# Patient Record
Sex: Male | Born: 2005 | Hispanic: No | Marital: Single | Smoking: Never smoker
Health system: Southern US, Community
[De-identification: ages and names within clinical notes are randomized; demographics above are authoritative.]

## PROBLEM LIST (undated history)

## (undated) DIAGNOSIS — M5416 Radiculopathy, lumbar region: Secondary | ICD-10-CM

---
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
## 2023-09-12 ENCOUNTER — Emergency Department
Admission: EM | Admit: 2023-09-12 | Discharge: 2023-09-12 | Disposition: A | Discharge status: Home / Self Care | Attending: Emergency Medicine | Admitting: Emergency Medicine

## 2023-09-12 ENCOUNTER — Emergency Department

## 2023-09-12 DIAGNOSIS — S299XXA Unspecified injury of thorax, initial encounter: Secondary | ICD-10-CM | POA: Diagnosis present

## 2023-09-12 DIAGNOSIS — S29011A Strain of muscle and tendon of front wall of thorax, initial encounter: Secondary | ICD-10-CM | POA: Insufficient documentation

## 2023-09-12 DIAGNOSIS — Y990 Civilian activity done for income or pay: Secondary | ICD-10-CM | POA: Insufficient documentation

## 2023-09-12 DIAGNOSIS — X503XXA Overexertion from repetitive movements, initial encounter: Secondary | ICD-10-CM | POA: Insufficient documentation

## 2023-09-12 HISTORY — DX: Radiculopathy, lumbar region: M54.16

## 2023-09-12 MED ORDER — KETOROLAC TROMETHAMINE 15 MG/ML IJ SOLN
15.0000 mg | Freq: Once | INTRAMUSCULAR | Status: AC
  Administered 2023-09-12: 15 mg via INTRAMUSCULAR
  Filled 2023-09-12: qty 1

## 2023-09-12 MED ORDER — NAPROXEN 500 MG PO TABS: 500.0000 mg | ORAL_TABLET | Freq: Two times a day (BID) | ORAL | 0 refills | Status: AC

## 2023-09-12 MED ORDER — OXYCODONE-ACETAMINOPHEN 5-325 MG PO TABS
1.0000 | ORAL_TABLET | Freq: Once | ORAL | Status: AC
  Administered 2023-09-12: 1 via ORAL
  Filled 2023-09-12: qty 1

## 2023-09-12 NOTE — ED Triage Notes (Signed)
 Per EMS, Pt, from [facility], c/o L chest pain x 3 days worsening today.  Pain score 7/10.  Pt reports the pain feels like cramping.  Denies associated cardiac symptoms.  Denies GU/GI complaints. Denies medical history.

## 2023-09-12 NOTE — ED Provider Notes (Signed)
 [facility] Provider Note    Event Date/Time   First MD Initiated Contact with Patient 09/12/23 [phone]     (approximate)   History   Chief Complaint: Chest Pain   HPI  Jeffery Padilla is a 18 y.o. male with no significant past medical history who comes ED complaining of left chest pain for the past 3 days, constant, worse with movement.  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  No diaphoresis vomiting or radiation.  No shortness of breath.  No palpitations or syncope.        Past Medical History:  Diagnosis Date   Lumbar radiculopathy     Current Outpatient Rx   Order #: [id number] Class: Normal    History reviewed. No pertinent surgical history.  Physical Exam   Triage Vital Signs: ED Triage Vitals  Encounter Vitals Group     BP 09/12/23 0923 (!) 150/76     Girls Systolic BP Percentile --      Girls Diastolic BP Percentile --      Boys Systolic BP Percentile --      Boys Diastolic BP Percentile --      Pulse Rate 09/12/23 0923 (!) 57     Resp 09/12/23 0923 20     Temp 09/12/23 0923 98.3 F (36.8 C)     Temp Source 09/12/23 0923 Oral     SpO2 09/12/23 0921 100 %     Weight 09/12/23 0923 (!) 315 lb (142.9 kg)     Height 09/12/23 0923 6' 3 (1.905 m)     Head Circumference --      Peak Flow --      Pain Score 09/12/23 0923 7     Pain Loc --      Pain Education --      Exclude from Growth Chart --     Most recent vital signs: Vitals:   09/12/23 0921 09/12/23 0923  BP:  (!) 150/76  Pulse:  (!) 57  Resp:  20  Temp:  98.3 F (36.8 C)  SpO2: 100% 100%    General: Awake, no distress.  CV:  Good peripheral perfusion.  Regular rate rhythm.  Normal distal pulses Resp:  Normal effort.  Clear to auscultation bilaterally Abd:  No distention.  Soft nontender Other:  Left pectoralis muscle tense and exquisitely tender along the lateral half reproducing the pain   ED Results /  Procedures / Treatments   Labs (all labs ordered are listed, but only abnormal results are displayed) Labs Reviewed - No data to display   EKG Interpreted by me Sinus rhythm rate of 72.  Normal axis intervals QRS ST segments T waves   RADIOLOGY Chest x-ray interpreted by me, appears normal.  Radiology report reviewed   PROCEDURES:  Procedures   MEDICATIONS ORDERED IN ED: Medications  ketorolac  (TORADOL ) 15 MG/ML injection 15 mg (15 mg Intramuscular Given 09/12/23 0955)  oxyCODONE -acetaminophen  (PERCOCET/ROXICET) 5-325 MG per tablet 1 tablet (1 tablet Oral Given 09/12/23 0955)     IMPRESSION / MDM / ASSESSMENT AND PLAN / ED COURSE  I reviewed the triage vital signs and the nursing notes.  DDx: Muscle strain, rib fracture, pneumothorax, arrhythmia.  Doubt ACS PE dissection  Patient's presentation is most consistent with acute presentation with potential threat to life or bodily function.     Clinical Course as of 09/12/23 1106  Mon Sep 12, 2023  9047 Patient presents with  left chest pain, exquisitely reproducible on palpation of the lateral pectoralis.  Vital signs unremarkable.  Doubt ACS PE dissection.  Will obtain chest x-ray to evaluate for pneumothorax.  Focus on pain control [PS]  1103 Chest x-ray and EKG normal.  Feeling better after NSAIDs.  Consistent with pectoralis strain. [PS]    Clinical Course User Index [PS] Viviann Pastor, MD     FINAL CLINICAL IMPRESSION(S) / ED DIAGNOSES   Final diagnoses:  Pectoralis muscle strain, initial encounter     Rx / DC Orders   ED Discharge Orders          Ordered    naproxen  (NAPROSYN ) 500 MG tablet  2 times daily with meals        09/12/23 1102             Note:  This document was prepared using Dragon voice recognition software and may include unintentional dictation errors.   Viviann Pastor, MD 09/12/23 [phone]

## 2023-10-17 ENCOUNTER — Other Ambulatory Visit: Payer: Self-pay

## 2023-10-17 ENCOUNTER — Emergency Department
Admission: EM | Admit: 2023-10-17 | Discharge: 2023-10-17 | Disposition: A | Discharge status: Home / Self Care | Attending: Emergency Medicine | Admitting: Emergency Medicine

## 2023-10-17 ENCOUNTER — Encounter: Payer: Self-pay | Admitting: Emergency Medicine

## 2023-10-17 DIAGNOSIS — E876 Hypokalemia: Secondary | ICD-10-CM | POA: Diagnosis not present

## 2023-10-17 DIAGNOSIS — T50901A Poisoning by unspecified drugs, medicaments and biological substances, accidental (unintentional), initial encounter: Secondary | ICD-10-CM

## 2023-10-17 DIAGNOSIS — T620X1A Toxic effect of ingested mushrooms, accidental (unintentional), initial encounter: Secondary | ICD-10-CM | POA: Insufficient documentation

## 2023-10-17 LAB — URINE DRUG SCREEN, QUALITATIVE (ARMC ONLY)
Amphetamines, Ur Screen: NOT DETECTED
Barbiturates, Ur Screen: NOT DETECTED
Benzodiazepine, Ur Scrn: NOT DETECTED
Cannabinoid 50 Ng, Ur ~~LOC~~: POSITIVE — AB
Cocaine Metabolite,Ur ~~LOC~~: NOT DETECTED
MDMA (Ecstasy)Ur Screen: NOT DETECTED
Methadone Scn, Ur: NOT DETECTED
Opiate, Ur Screen: NOT DETECTED
Phencyclidine (PCP) Ur S: NOT DETECTED
Tricyclic, Ur Screen: NOT DETECTED

## 2023-10-17 LAB — COMPREHENSIVE METABOLIC PANEL WITH GFR
ALT: 15 U/L (ref 0–44)
AST: 23 U/L (ref 15–41)
Albumin: 4 g/dL (ref 3.5–5.0)
Alkaline Phosphatase: 63 U/L (ref 38–126)
Anion gap: 9 (ref 5–15)
BUN: 8 mg/dL (ref 6–20)
CO2: 24 mmol/L (ref 22–32)
Calcium: 8.9 mg/dL (ref 8.9–10.3)
Chloride: 107 mmol/L (ref 98–111)
Creatinine, Ser: 1.01 mg/dL (ref 0.61–1.24)
GFR, Estimated: >60 mL/min (ref >60)
Glucose, Bld: 155 mg/dL — ABNORMAL HIGH (ref 70–99)
Potassium: 3.2 mmol/L — ABNORMAL LOW (ref 3.5–5.1)
Sodium: 140 mmol/L (ref 135–145)
Total Bilirubin: 0.6 mg/dL (ref 0.0–1.2)
Total Protein: 7.1 g/dL (ref 6.5–8.1)

## 2023-10-17 LAB — CBC
HCT: 43.6 % (ref 39.0–52.0)
Hemoglobin: 14.2 g/dL (ref 13.0–17.0)
MCH: 27.7 pg (ref 26.0–34.0)
MCHC: 32.6 g/dL (ref 30.0–36.0)
MCV: 85.2 fL (ref 80.0–100.0)
Platelets: 267 K/uL (ref 150–400)
RBC: 5.12 MIL/uL (ref 4.22–5.81)
RDW: 13.8 % (ref 11.5–15.5)
WBC: 11.4 K/uL — ABNORMAL HIGH (ref 4.0–10.5)
nRBC: 0 % (ref 0.0–0.2)

## 2023-10-17 MED ORDER — POTASSIUM CHLORIDE CRYS ER 20 MEQ PO TBCR
40.0000 meq | EXTENDED_RELEASE_TABLET | Freq: Once | ORAL | Status: AC
  Administered 2023-10-17: 40 meq via ORAL
  Filled 2023-10-17: qty 2

## 2023-10-17 NOTE — ED Provider Notes (Signed)
 [facility] Provider Note    Event Date/Time   First MD Initiated Contact with Patient 10/17/23 0148     (approximate)   History   Ingestion   HPI  Jeffery Padilla is a 18 y.o. male with no significant past medical history who presents to the emergency department complaining of not feeling well after he ingested mamanita magic mushroom gummies tonight.  States he was taking this for fine and was not an intent to harm himself.  No other ingestions.  States he felt nauseated and was having hallucinations.  Now feels back to his baseline.   History provided by patient, significant other.    Past Medical History:  Diagnosis Date   Lumbar radiculopathy     History reviewed. No pertinent surgical history.  MEDICATIONS:  Prior to Admission medications   Medication Sig Start Date End Date Taking? Authorizing Provider  naproxen  (NAPROSYN ) 500 MG tablet Take 1 tablet (500 mg total) by mouth 2 (two) times daily with a meal. 09/12/23   Viviann Pastor, MD    Physical Exam   Triage Vital Signs: ED Triage Vitals  Encounter Vitals Group     BP 10/17/23 0019 (!) 153/82     Girls Systolic BP Percentile --      Girls Diastolic BP Percentile --      Boys Systolic BP Percentile --      Boys Diastolic BP Percentile --      Pulse Rate 10/17/23 0019 92     Resp 10/17/23 0019 20     Temp 10/17/23 0019 98.7 F (37.1 C)     Temp Source 10/17/23 0019 Oral     SpO2 10/17/23 0019 99 %     Weight 10/17/23 0020 (!) 320 lb (145.2 kg)     Height 10/17/23 0020 6' 3 (1.905 m)     Head Circumference --      Peak Flow --      Pain Score 10/17/23 0020 0     Pain Loc --      Pain Education --      Exclude from Growth Chart --     Most recent vital signs: Vitals:   10/17/23 0019 10/17/23 0224  BP: (!) 153/82   Pulse: 92   Resp: 20   Temp: 98.7 F (37.1 C)   SpO2: 99% 100%    CONSTITUTIONAL: Alert, responds appropriately to questions. Well-appearing;  well-nourished HEAD: Normocephalic, atraumatic EYES: Conjunctivae clear, pupils appear equal, sclera nonicteric ENT: normal nose; moist mucous membranes NECK: Supple, normal ROM CARD: RRR; S1 and S2 appreciated RESP: Normal chest excursion without splinting or tachypnea; breath sounds clear and equal bilaterally; no wheezes, no rhonchi, no rales, no hypoxia or respiratory distress, speaking full sentences ABD/GI: Non-distended; soft, non-tender, no rebound, no guarding, no peritoneal signs BACK: The back appears normal EXT: Normal ROM in all joints; no deformity noted, no edema SKIN: Normal color for age and race; warm; no rash on exposed skin NEURO: Moves all extremities equally, normal speech PSYCH: The patient's mood and manner are appropriate.   ED Results / Procedures / Treatments   LABS: (all labs ordered are listed, but only abnormal results are displayed) Labs Reviewed  COMPREHENSIVE METABOLIC PANEL WITH GFR - Abnormal; Notable for the following components:      Result Value   Potassium 3.2 (*)    Glucose, Bld 155 (*)    All other components within normal limits  CBC - Abnormal; Notable for the following  components:   WBC 11.4 (*)    All other components within normal limits  URINE DRUG SCREEN, QUALITATIVE ([facility] ONLY) - Abnormal; Notable for the following components:   Cannabinoid 50 Ng, Ur [place] POSITIVE (*)    All other components within normal limits  ETHANOL     EKG:  EKG Interpretation Date/Time:    Ventricular Rate:    PR Interval:    QRS Duration:    QT Interval:    QTC Calculation:   R Axis:      Text Interpretation:           RADIOLOGY: My personal review and interpretation of imaging:    I have personally reviewed all radiology reports.   No results found.   PROCEDURES:    Procedures    IMPRESSION / MDM / ASSESSMENT AND PLAN / ED COURSE  I reviewed the triage vital signs and the nursing notes.    Patient here after intentional  ingestion of over-the-counter mushroom candy that is THC free but per their website contains nootropics and legal tryptamines.  The patient is on the cardiac monitor to evaluate for evidence of arrhythmia and/or significant heart rate changes.   DIFFERENTIAL DIAGNOSIS (includes but not limited to):   Accidental overdose of psychedelics/psilocybin, no suicidal ideation, doubt toxic mushroom ingestion   Patient's presentation is most consistent with acute presentation with potential threat to life or bodily function.   PLAN: Patient's labs show potassium of 3.2.  Will give oral replacement.  Normal creatinine, LFTs.  Patient now asymptomatic.  He denies that this was an attempt to harm himself.  No sign currently of serotonin syndrome.  Patient is well-appearing, hemodynamically stable.  I feel at this time he is safe for discharge home.   MEDICATIONS GIVEN IN ED: Medications  potassium chloride  SA (KLOR-CON  M) CR tablet 40 mEq (40 mEq Oral Given 10/17/23 0224)     ED COURSE:  At this time, I do not feel there is any life-threatening condition present. I reviewed all nursing notes, vitals, pertinent previous records.  All lab and urine results, EKGs, imaging ordered have been independently reviewed and interpreted by myself.  I reviewed all available radiology reports from any imaging ordered this visit.  Based on my assessment, I feel the patient is safe to be discharged home without further emergent workup and can continue workup as an outpatient as needed. Discussed all findings, treatment plan as well as usual and customary return precautions.  They verbalize understanding and are comfortable with this plan.  Outpatient follow-up has been provided as needed.  All questions have been answered.    CONSULTS:  none   OUTSIDE RECORDS REVIEWED: Reviewed previous physical therapy, PMR, orthopedic notes in 2021 and 2022.       FINAL CLINICAL IMPRESSION(S) / ED DIAGNOSES   Final  diagnoses:  Accidental overdose, initial encounter  Toxic effect of ingested mushroom, unintentional, initial encounter     Rx / DC Orders   ED Discharge Orders     None        Note:  This document was prepared using Dragon voice recognition software and may include unintentional dictation errors.   Daniyah Fohl, Josette SAILOR, DO 10/17/23 [phone]

## 2023-10-17 NOTE — ED Triage Notes (Signed)
 Patient reports eating 6 gummies at 2330. Gummies are 800mg  mushroom candy. Patient reports not feeling good - unable to elaborate.

## 2024-02-03 ENCOUNTER — Other Ambulatory Visit: Payer: Self-pay

## 2024-02-03 ENCOUNTER — Emergency Department: Payer: Self-pay

## 2024-02-03 ENCOUNTER — Emergency Department
Admission: EM | Admit: 2024-02-03 | Discharge: 2024-02-03 | Disposition: A | Discharge status: Home / Self Care | Payer: Self-pay

## 2024-02-03 DIAGNOSIS — R197 Diarrhea, unspecified: Secondary | ICD-10-CM | POA: Insufficient documentation

## 2024-02-03 DIAGNOSIS — R110 Nausea: Secondary | ICD-10-CM | POA: Insufficient documentation

## 2024-02-03 DIAGNOSIS — D72829 Elevated white blood cell count, unspecified: Secondary | ICD-10-CM | POA: Insufficient documentation

## 2024-02-03 DIAGNOSIS — R1030 Lower abdominal pain, unspecified: Secondary | ICD-10-CM | POA: Insufficient documentation

## 2024-02-03 LAB — COMPREHENSIVE METABOLIC PANEL WITH GFR
ALT: 12 U/L (ref 0–44)
AST: 19 U/L (ref 15–41)
Albumin: 4.5 g/dL (ref 3.5–5.0)
Alkaline Phosphatase: 98 U/L (ref 38–126)
Anion gap: 17 — ABNORMAL HIGH (ref 5–15)
BUN: 9 mg/dL (ref 6–20)
CO2: 20 mmol/L — ABNORMAL LOW (ref 22–32)
Calcium: 9.9 mg/dL (ref 8.9–10.3)
Chloride: 104 mmol/L (ref 98–111)
Creatinine, Ser: 0.98 mg/dL (ref 0.61–1.24)
GFR, Estimated: >60 mL/min (ref >60)
Glucose, Bld: 109 mg/dL — ABNORMAL HIGH (ref 70–99)
Potassium: 3.6 mmol/L (ref 3.5–5.1)
Sodium: 142 mmol/L (ref 135–145)
Total Bilirubin: 0.5 mg/dL (ref 0.0–1.2)
Total Protein: 7.7 g/dL (ref 6.5–8.1)

## 2024-02-03 LAB — URINALYSIS, ROUTINE W REFLEX MICROSCOPIC
Bacteria, UA: NONE SEEN
Glucose, UA: NEGATIVE mg/dL
Hgb urine dipstick: NEGATIVE
Ketones, ur: 20 mg/dL — AB
Leukocytes,Ua: NEGATIVE
Nitrite: NEGATIVE
Protein, ur: 100 mg/dL — AB
Specific Gravity, Urine: 1.041 — ABNORMAL HIGH (ref 1.005–1.030)
Squamous Epithelial / HPF: 0 /HPF (ref 0–5)
pH: 6 (ref 5.0–8.0)

## 2024-02-03 LAB — CBC
HCT: 49.4 % (ref 39.0–52.0)
Hemoglobin: 16.3 g/dL (ref 13.0–17.0)
MCH: 27.7 pg (ref 26.0–34.0)
MCHC: 33 g/dL (ref 30.0–36.0)
MCV: 84 fL (ref 80.0–100.0)
Platelets: 334 K/uL (ref 150–400)
RBC: 5.88 MIL/uL — ABNORMAL HIGH (ref 4.22–5.81)
RDW: 13.1 % (ref 11.5–15.5)
WBC: 14.7 K/uL — ABNORMAL HIGH (ref 4.0–10.5)
nRBC: 0 % (ref 0.0–0.2)

## 2024-02-03 LAB — RESP PANEL BY RT-PCR (RSV, FLU A&B, COVID)  RVPGX2
Influenza A by PCR: NEGATIVE
Influenza B by PCR: NEGATIVE
Resp Syncytial Virus by PCR: NEGATIVE
SARS Coronavirus 2 by RT PCR: NEGATIVE

## 2024-02-03 LAB — LIPASE, BLOOD: Lipase: 16 U/L (ref 11–51)

## 2024-02-03 MED ORDER — FAMOTIDINE 20 MG PO TABS: 20.0000 mg | ORAL_TABLET | Freq: Two times a day (BID) | ORAL | 0 refills | Status: AC

## 2024-02-03 MED ORDER — ACETAMINOPHEN 500 MG PO TABS: 1000.0000 mg | ORAL_TABLET | Freq: Four times a day (QID) | ORAL | 2 refills | Status: AC | PRN

## 2024-02-03 MED ORDER — IBUPROFEN 200 MG PO TABS: 600.0000 mg | ORAL_TABLET | Freq: Three times a day (TID) | ORAL | 2 refills | Status: AC | PRN

## 2024-02-03 MED ORDER — ALUMINUM-MAGNESIUM-SIMETHICONE 200-200-20 MG/5ML PO SUSP: 30.0000 mL | Freq: Three times a day (TID) | ORAL | 0 refills | Status: AC

## 2024-02-03 MED ORDER — IOHEXOL 300 MG/ML  SOLN
100.0000 mL | Freq: Once | INTRAMUSCULAR | Status: AC | PRN
  Administered 2024-02-03: 100 mL via INTRAVENOUS

## 2024-02-03 MED ORDER — ONDANSETRON HCL 4 MG/2ML IJ SOLN
4.0000 mg | Freq: Once | INTRAMUSCULAR | Status: AC
  Administered 2024-02-03: 4 mg via INTRAVENOUS
  Filled 2024-02-03: qty 2

## 2024-02-03 MED ORDER — SODIUM CHLORIDE 0.9 % IV BOLUS
1000.0000 mL | Freq: Once | INTRAVENOUS | Status: AC
  Administered 2024-02-03: 1000 mL via INTRAVENOUS

## 2024-02-03 MED ORDER — KETOROLAC TROMETHAMINE 15 MG/ML IJ SOLN
15.0000 mg | Freq: Once | INTRAMUSCULAR | Status: AC
  Administered 2024-02-03: 15 mg via INTRAVENOUS
  Filled 2024-02-03: qty 1

## 2024-02-03 MED ORDER — ONDANSETRON 4 MG PO TBDP: 4.0000 mg | ORAL_TABLET | Freq: Three times a day (TID) | ORAL | 0 refills | Status: AC | PRN

## 2024-02-03 NOTE — ED Provider Notes (Signed)
 [facility] Provider Note    Event Date/Time   First MD Initiated Contact with Patient 02/03/24 1402     (approximate)   History   Abdominal Pain  Abd pain since Wednesday.  C/O chills, palpitations, nausea, and diarrhea.    7 hydroxy (product of Kardem) - normally takes daily, last taken Tuesday night.  AAOx3. Skin warm and dry. NAD   HPI Jeffery Padilla is a 18 y.o. male no related past medical history presents for evaluation of nausea, diarrhea, chills, palpitations - Primarily here because he has been having diarrhea and lower abdominal pain for about the past 3 days, some ongoing nausea, 1 episode of vomiting a few nights ago.  Started shortly after eating Chinese food. -No fevers -Has noticed some darkening of his urine -No abdominal surgical history -Does note diffuse lower abdominal pain, 7/10 -Has also been using 7-hydroxymytragynine for the past few weeks, reportedly quit marijuana few months ago and was using this as a replacement      Physical Exam   Triage Vital Signs: ED Triage Vitals  Encounter Vitals Group     BP 02/03/24 1218 135/82     Girls Systolic BP Percentile --      Girls Diastolic BP Percentile --      Boys Systolic BP Percentile --      Boys Diastolic BP Percentile --      Pulse Rate 02/03/24 1218 (!) 102     Resp 02/03/24 1218 16     Temp 02/03/24 1218 98.1 F (36.7 C)     Temp Source 02/03/24 1218 Oral     SpO2 02/03/24 1218 100 %     Weight 02/03/24 1219 (!) 320 lb 1.7 oz (145.2 kg)     Height --      Head Circumference --      Peak Flow --      Pain Score 02/03/24 1218 7     Pain Loc --      Pain Education --      Exclude from Growth Chart --     Most recent vital signs: Vitals:   02/03/24 1218  BP: 135/82  Pulse: (!) 102  Resp: 16  Temp: 98.1 F (36.7 C)  SpO2: 100%     General: Awake, no distress.  CV:  Good peripheral perfusion. RRR, RP 2+ Resp:  Normal effort. CTAB Abd:  No distention.   Moderate tenderness throughout lower abdomen including right lower quadrant, no significant CVAT bilaterally    ED Results / Procedures / Treatments   Labs (all labs ordered are listed, but only abnormal results are displayed) Labs Reviewed  COMPREHENSIVE METABOLIC PANEL WITH GFR - Abnormal; Notable for the following components:      Result Value   CO2 20 (*)    Glucose, Bld 109 (*)    Anion gap 17 (*)    All other components within normal limits  CBC - Abnormal; Notable for the following components:   WBC 14.7 (*)    RBC 5.88 (*)    All other components within normal limits  URINALYSIS, ROUTINE W REFLEX MICROSCOPIC - Abnormal; Notable for the following components:   Color, Urine AMBER (*)    APPearance HAZY (*)    Specific Gravity, Urine 1.041 (*)    Bilirubin Urine SMALL (*)    Ketones, ur 20 (*)    Protein, ur 100 (*)    All other components within normal limits  RESP PANEL BY RT-PCR (  RSV, FLU A&B, COVID)  RVPGX2  LIPASE, BLOOD     EKG  N/a   RADIOLOGY Radiology interpreted by myself and radiology report reviewed.  No acute pathology identified.    PROCEDURES:  Critical Care performed: No  Procedures   MEDICATIONS ORDERED IN ED: Medications  sodium chloride 0.9 % bolus 1,000 mL (0 mLs Intravenous Stopped 02/03/24 1530)  ondansetron  (ZOFRAN ) injection 4 mg (4 mg Intravenous Given 02/03/24 1438)  ketorolac  (TORADOL ) 15 MG/ML injection 15 mg (15 mg Intravenous Given 02/03/24 1438)  iohexol (OMNIPAQUE) 300 MG/ML solution 100 mL (100 mLs Intravenous Contrast Given 02/03/24 1502)     IMPRESSION / MDM / ASSESSMENT AND PLAN / ED COURSE  I reviewed the triage vital signs and the nursing notes.                              DDX/MDM/AP: Differential diagnosis includes, but is not limited to, gastroenteritis, viral syndrome including influenza, consider appendicitis, diverticulitis, UTI.  Consider but doubt withdrawal syndromes though appears patient only stopped  taking medication because he started feeling ill given.  Plan: - Labs  -IV fluid, Zofran , Toradol  - CT abdomen pelvis - Reassess  Patient's presentation is most consistent with acute presentation with potential threat to life or bodily function.  The patient is on the cardiac monitor to evaluate for evidence of arrhythmia and/or significant heart rate changes.  ED course below.   Clinical Course as of 02/03/24 1610  Fri Feb 03, 2024  1407 CBC with leukocytosis 14.7, otherwise unremarkable  CMP reviewed, overall unremarkable, hepatobiliary labs normal [MM]  1407 Urinalysis with trace pyuria, nitrite and leuk esterase negative, no squamous cells [MM]  1553 CTAP: IMPRESSION: No acute abnormality seen.   [MM]  1606 Patient reevaluated, remains very well-appearing here, vital signs stable.  No clear evidence of acute pathology at this time.  Suspect possible viral syndrome or side effect of the 7 hydroxy that he has been taking.  Stable for outpatient follow-up from my perspective.  Will Rx Tylenol , Motrin, famotidine, Maalox as well as Zofran .  ED return precautions in place.  Patient agrees with plan.  Will provide information for primary care follow-up. [MM]    Clinical Course User Index [MM] Clarine Ozell LABOR, MD     FINAL CLINICAL IMPRESSION(S) / ED DIAGNOSES   Final diagnoses:  Lower abdominal pain  Diarrhea, unspecified type  Nausea     Rx / DC Orders   ED Discharge Orders          Ordered    aluminum-magnesium hydroxide-simethicone (MAALOX) 200-200-20 MG/5ML SUSP  3 times daily before meals & bedtime        02/03/24 1608    famotidine (PEPCID) 20 MG tablet  2 times daily        02/03/24 1608    acetaminophen  (TYLENOL ) 500 MG tablet  Every 6 hours PRN        02/03/24 1608    ibuprofen (MOTRIN IB) 200 MG tablet  Every 8 hours PRN        02/03/24 1608    ondansetron  (ZOFRAN -ODT) 4 MG disintegrating tablet  Every 8 hours PRN        02/03/24 1608              Note:  This document was prepared using Dragon voice recognition software and may include unintentional dictation errors.   Clarine Ozell LABOR, MD 02/03/24 [phone]

## 2024-02-03 NOTE — Discharge Instructions (Signed)
 Your evaluation in the emergency department was overall reassuring.  I am unsure as to the exact cause of your symptoms, but it may be a side effect of the 7 hydroxy that you were using or possibly a viral illness.  I anticipate you should improve over the next few days.  I prescribed several pain medications, nausea medication, and antacid medications to help with your symptoms.  Please do follow-up with a primary care provider for reevaluation, and return to the emergency department with any new or worsening symptoms.

## 2024-02-03 NOTE — ED Triage Notes (Signed)
 Abd pain since Wednesday.  C/O chills, palpitations, nausea, and diarrhea.    7 hydroxy (product of Kardem) - normally takes daily, last taken Tuesday night.  AAOx3. Skin warm and dry. NAD
